# Patient Record
Sex: Male | Born: 1985 | Race: White | Hispanic: No | Marital: Married | Smoking: Never smoker
Health system: Southern US, Community
[De-identification: ages and names within clinical notes are randomized; demographics above are authoritative.]

## PROBLEM LIST (undated history)

## (undated) DIAGNOSIS — F32A Depression, unspecified: Secondary | ICD-10-CM

## (undated) HISTORY — DX: Depression, unspecified: F32.A

## (undated) HISTORY — PX: INGUINAL HERNIA REPAIR: SUR1180

## (undated) HISTORY — PX: ANTERIOR CRUCIATE LIGAMENT REPAIR: SHX115

---
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
## 2014-10-12 ENCOUNTER — Encounter (HOSPITAL_COMMUNITY): Payer: Self-pay

## 2014-10-12 ENCOUNTER — Emergency Department (HOSPITAL_COMMUNITY)
Admission: EM | Admit: 2014-10-12 | Discharge: 2014-10-13 | Disposition: A | Discharge status: Home / Self Care | Payer: BLUE CROSS/BLUE SHIELD | Attending: Emergency Medicine | Admitting: Emergency Medicine

## 2014-10-12 ENCOUNTER — Emergency Department (HOSPITAL_COMMUNITY): Payer: BLUE CROSS/BLUE SHIELD

## 2014-10-12 DIAGNOSIS — R42 Dizziness and giddiness: Secondary | ICD-10-CM | POA: Diagnosis not present

## 2014-10-12 DIAGNOSIS — R110 Nausea: Secondary | ICD-10-CM | POA: Insufficient documentation

## 2014-10-12 MED ORDER — SODIUM CHLORIDE 0.9 % IV BOLUS (SEPSIS)
1000.0000 mL | Freq: Once | INTRAVENOUS | Status: AC
  Administered 2014-10-12: 1000 mL via INTRAVENOUS

## 2014-10-12 MED ORDER — MECLIZINE HCL 12.5 MG PO TABS
25.0000 mg | ORAL_TABLET | Freq: Once | ORAL | Status: AC
  Administered 2014-10-12: 25 mg via ORAL
  Filled 2014-10-12: qty 2

## 2014-10-12 NOTE — ED Provider Notes (Signed)
CSN: [id number]     Arrival date & time 10/12/14  2123 History   This chart was scribed for Geoffery Lyonsouglas Alva Kuenzel, MD by Evon Slackerrance Branch, ED Scribe. This patient was seen in room APA15/APA15 and the patient's care was started at 11:26 PM.    Chief Complaint  Patient presents with  . Dizziness    Patient is a 29 y.o. male presenting with dizziness. The history is provided by the patient. No language interpreter was used.  Dizziness Quality:  Room spinning Severity:  Mild Onset quality:  Sudden Duration:  1 day Timing:  Intermittent Progression:  Unchanged Chronicity:  New Context: head movement and standing up   Relieved by:  None tried Worsened by:  Nothing Ineffective treatments:  None tried Associated symptoms: nausea   Associated symptoms: no tinnitus and no vomiting    HPI Comments: Ricky Day is a 29 y.o. male who presents to the Emergency Department complaining of new sudden onset of intermittent dizziness that began today 10 AM. Pt describes the dizziness as the room spinning. Pt states that he has associated nausea. Pt states that the dizziness is worse when sitting up or head movement. PT denies head injury, fever, tinnitus or vomiting.   History reviewed. No pertinent past medical history. History reviewed. No pertinent past surgical history. History reviewed. No pertinent family history. History  Substance Use Topics  . Smoking status: Never Smoker   . Smokeless tobacco: Never Used  . Alcohol Use: Yes     Comment: occasional    Review of Systems  Constitutional: Negative for fever.  HENT: Negative for tinnitus.   Gastrointestinal: Positive for nausea. Negative for vomiting.  Neurological: Positive for dizziness. Negative for syncope.  All other systems reviewed and are negative.    Allergies  Review of patient's allergies indicates no known allergies.  Home Medications   Prior to Admission medications   Not on File   BP 107/45 mmHg  Pulse 89  Temp(Src)  98.1 F (36.7 C) (Oral)  Resp 18  Ht 5\' 7"  (1.702 m)  Wt 240 lb (108.863 kg)  BMI 37.58 kg/m2  SpO2 98%   Physical Exam  Constitutional: He is oriented to person, place, and time. He appears well-developed and well-nourished. No distress.  HENT:  Head: Normocephalic and atraumatic.  Eyes: Conjunctivae and EOM are normal. Pupils are equal, round, and reactive to light.  Neck: Neck supple. No tracheal deviation present.  Cardiovascular: Normal rate and regular rhythm.   No murmur heard. Pulmonary/Chest: Effort normal and breath sounds normal. No respiratory distress. He has no wheezes. He has no rales.  Abdominal: Soft. There is no tenderness.  Musculoskeletal: Normal range of motion.  Neurological: He is alert and oriented to person, place, and time.  Skin: Skin is warm and dry.  Psychiatric: He has a normal mood and affect. His behavior is normal.  Nursing note and vitals reviewed.   ED Course  Procedures (including critical care time) DIAGNOSTIC STUDIES: Oxygen Saturation is 98% on RA, normal by my interpretation.    COORDINATION OF CARE: 11:30 PM-Discussed treatment plan with pt at bedside and pt agreed to plan.     Labs Review Labs Reviewed - No data to display  Imaging Review No results found.   EKG Interpretation None      MDM   Final diagnoses:  None      Patient presents with complaints of dizziness that started this morning. This is described as a spinning sensation and is worse  with movement and turning his head. His history, exam, and workup findings are most consistent with a peripheral vertigo. He is feeling better with meclizine and I believe is appropriate for discharge. He will be given a prescription for meclizine and advised to return if his symptoms significantly worsen or change.   I personally performed the services described in this documentation, which was scribed in my presence. The recorded information has been reviewed and is  accurate.      Geoffery Lyons, MD 10/13/14 [phone]

## 2014-10-12 NOTE — ED Notes (Signed)
Patient states he began to have dizziness that started this morning when he stands up, patient states nausea with dizzy spell.

## 2014-10-13 LAB — CBC WITH DIFFERENTIAL/PLATELET
Basophils Absolute: 0 10*3/uL (ref 0.0–0.1)
Basophils Relative: 0 % (ref 0–1)
Eosinophils Absolute: 0.2 10*3/uL (ref 0.0–0.7)
Eosinophils Relative: 2 % (ref 0–5)
HCT: 49.3 % (ref 39.0–52.0)
Hemoglobin: 16.7 g/dL (ref 13.0–17.0)
Lymphocytes Relative: 38 % (ref 12–46)
Lymphs Abs: 3.8 10*3/uL (ref 0.7–4.0)
MCH: 30.6 pg (ref 26.0–34.0)
MCHC: 33.9 g/dL (ref 30.0–36.0)
MCV: 90.5 fL (ref 78.0–100.0)
Monocytes Absolute: 0.6 10*3/uL (ref 0.1–1.0)
Monocytes Relative: 6 % (ref 3–12)
Neutro Abs: 5.4 10*3/uL (ref 1.7–7.7)
Neutrophils Relative %: 54 % (ref 43–77)
Platelets: 181 10*3/uL (ref 150–400)
RBC: 5.45 MIL/uL (ref 4.22–5.81)
RDW: 13.1 % (ref 11.5–15.5)
WBC: 10 10*3/uL (ref 4.0–10.5)

## 2014-10-13 LAB — COMPREHENSIVE METABOLIC PANEL
ALT: 75 U/L — ABNORMAL HIGH (ref 0–53)
AST: 35 U/L (ref 0–37)
Albumin: 4.3 g/dL (ref 3.5–5.2)
Alkaline Phosphatase: 86 U/L (ref 39–117)
Anion gap: 6 (ref 5–15)
BUN: 13 mg/dL (ref 6–23)
CO2: 28 mmol/L (ref 19–32)
Calcium: 9.5 mg/dL (ref 8.4–10.5)
Chloride: 106 mmol/L (ref 96–112)
Creatinine, Ser: 1 mg/dL (ref 0.50–1.35)
GFR calc Af Amer: >90 mL/min (ref >90)
GFR calc non Af Amer: >90 mL/min (ref >90)
Glucose, Bld: 95 mg/dL (ref 70–99)
Potassium: 4.1 mmol/L (ref 3.5–5.1)
Sodium: 140 mmol/L (ref 135–145)
Total Bilirubin: 0.4 mg/dL (ref 0.3–1.2)
Total Protein: 7.7 g/dL (ref 6.0–8.3)

## 2014-10-13 MED ORDER — MECLIZINE HCL 25 MG PO TABS: 25.0000 mg | ORAL_TABLET | Freq: Three times a day (TID) | ORAL | Status: DC | PRN

## 2014-10-13 NOTE — Discharge Instructions (Signed)
Meclizine as prescribed as needed for dizziness. ° °Return to the emergency department if your symptoms significantly worsen or change. ° ° °Vertigo °Vertigo means you feel like you or your surroundings are moving when they are not. Vertigo can be dangerous if it occurs when you are at work, driving, or performing difficult activities.  °CAUSES  °Vertigo occurs when there is a conflict of signals sent to your brain from the visual and sensory systems in your body. There are many different causes of vertigo, including: °· Infections, especially in the inner ear. °· A bad reaction to a drug or misuse of alcohol and medicines. °· Withdrawal from drugs or alcohol. °· Rapidly changing positions, such as lying down or rolling over in bed. °· A migraine headache. °· Decreased blood flow to the brain. °· Increased pressure in the brain from a head injury, infection, tumor, or bleeding. °SYMPTOMS  °You may feel as though the world is spinning around or you are falling to the ground. Because your balance is upset, vertigo can cause nausea and vomiting. You may have involuntary eye movements (nystagmus). °DIAGNOSIS  °Vertigo is usually diagnosed by physical exam. If the cause of your vertigo is unknown, your caregiver may perform imaging tests, such as an MRI scan (magnetic resonance imaging). °TREATMENT  °Most cases of vertigo resolve on their own, without treatment. Depending on the cause, your caregiver may prescribe certain medicines. If your vertigo is related to body position issues, your caregiver may recommend movements or procedures to correct the problem. In rare cases, if your vertigo is caused by certain inner ear problems, you may need surgery. °HOME CARE INSTRUCTIONS  °· Follow your caregiver's instructions. °· Avoid driving. °· Avoid operating heavy machinery. °· Avoid performing any tasks that would be dangerous to you or others during a vertigo episode. °· Tell your caregiver if you notice that certain  medicines seem to be causing your vertigo. Some of the medicines used to treat vertigo episodes can actually make them worse in some people. °SEEK IMMEDIATE MEDICAL CARE IF:  °· Your medicines do not relieve your vertigo or are making it worse. °· You develop problems with talking, walking, weakness, or using your arms, hands, or legs. °· You develop severe headaches. °· Your nausea or vomiting continues or gets worse. °· You develop visual changes. °· A family member notices behavioral changes. °· Your condition gets worse. °MAKE SURE YOU: °· Understand these instructions. °· Will watch your condition. °· Will get help right away if you are not doing well or get worse. °Document Released: 04/11/2005 Document Revised: 09/24/2011 Document Reviewed: 01/18/2011 °Exit[employer]® Patient Information ©2015 [employer]. This information is not intended to replace advice given to you by your health care provider. Make sure you discuss any questions you have with your health care provider. ° °

## 2014-10-13 NOTE — ED Notes (Signed)
Discharge instructions given and reviewed with patient.  Patient verbalized understanding to take medication as directed and to return to ED for any worsening symptoms.  Patient ambulatory; discharged home in good condition.

## 2016-03-10 IMAGING — CT CT HEAD W/O CM
1 series · 16 of 30 positions shown, 20 images · non-contrast
Comparison: None.

CLINICAL DATA: Acute onset of dizziness.  Initial encounter.

EXAM:
CT HEAD WITHOUT CONTRAST
TECHNIQUE: Contiguous axial images were obtained from the base of the skull
through the vertex without intravenous contrast.

[Series 2: headtrauma 4.8 h37s · axial · 0.46mm/px · z∈[+63,+193]mm · 16 of 30 slices shown, 20 images]
[im 2/30  brain]
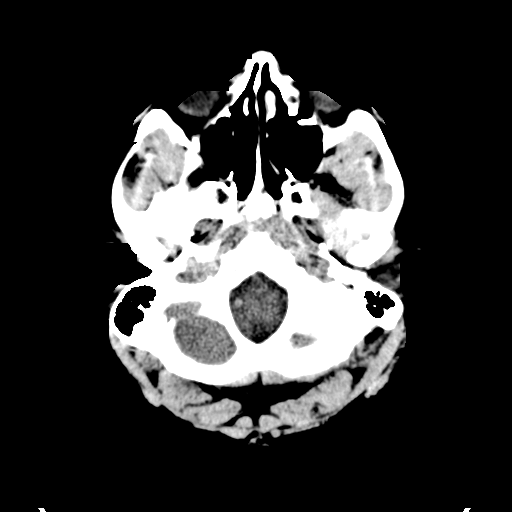
[im 2/30  bone]
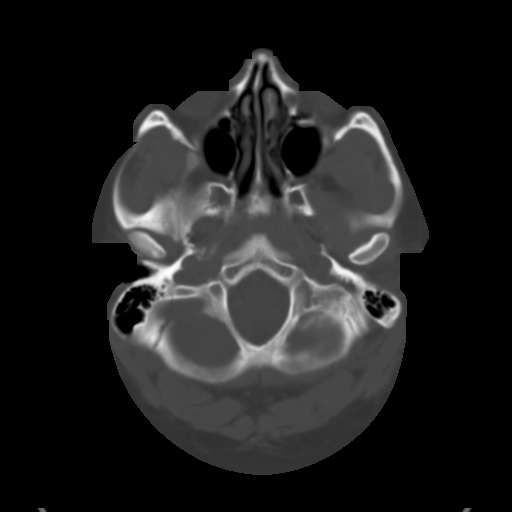
[im 4/30  brain]
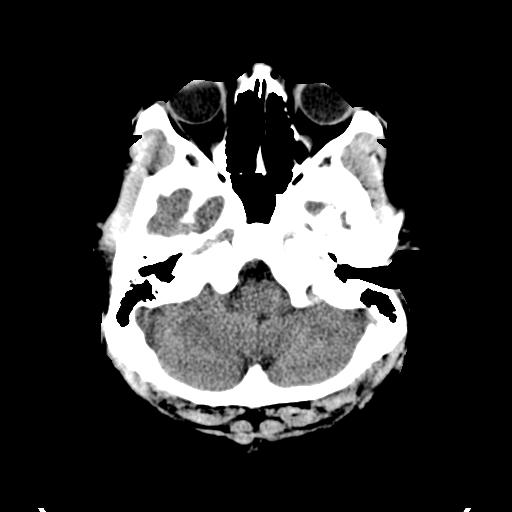
[im 6/30  brain]
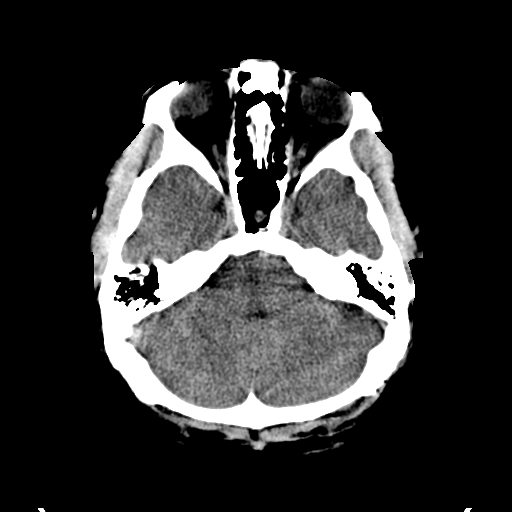
[im 8/30  brain]
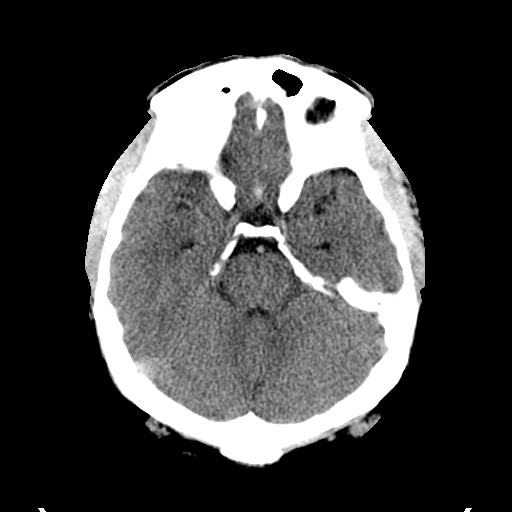
[im 9/30  brain]
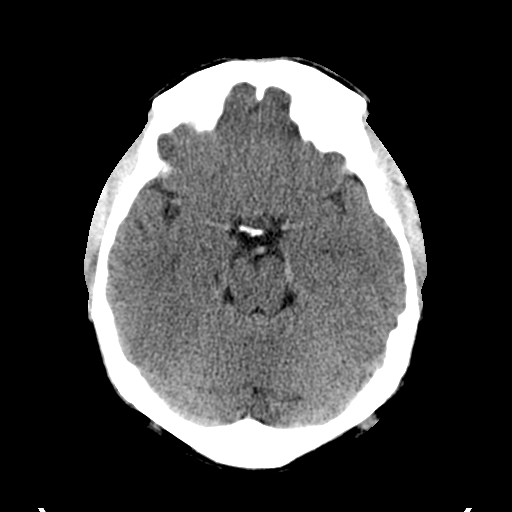
[im 9/30  bone]
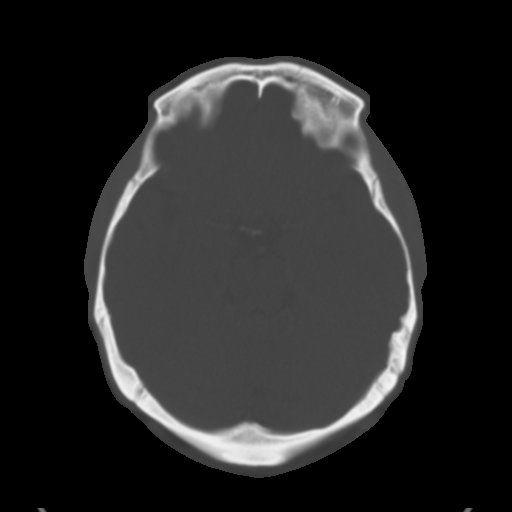
[im 11/30  brain]
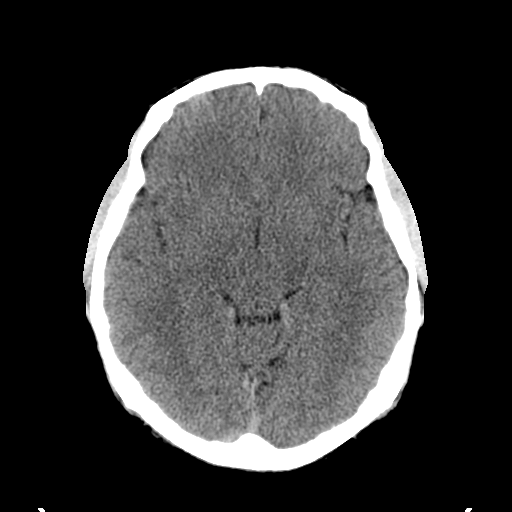
[im 13/30  brain]
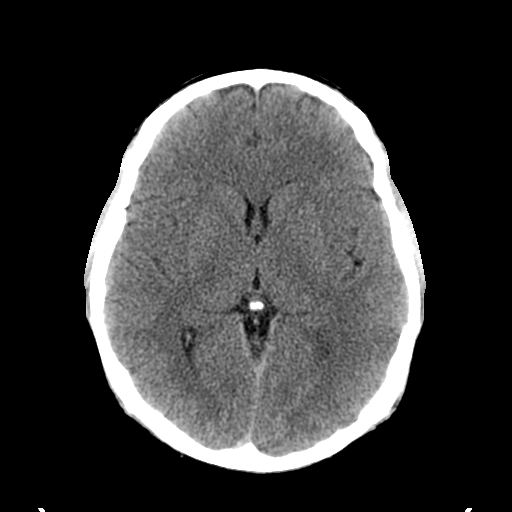
[im 15/30  brain]
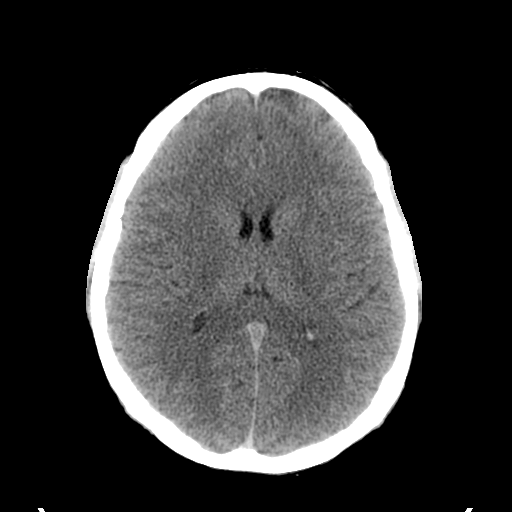
[im 16/30  brain]
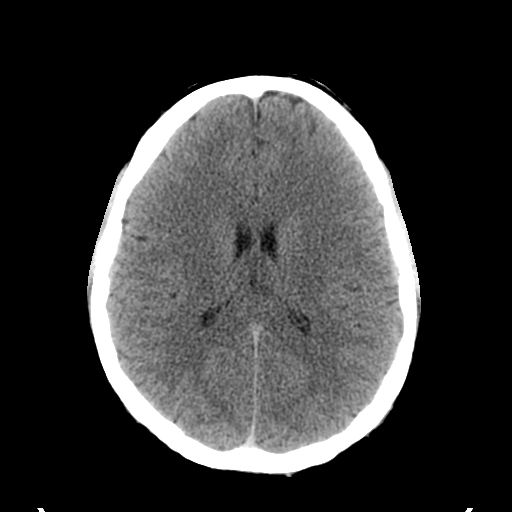
[im 16/30  bone]
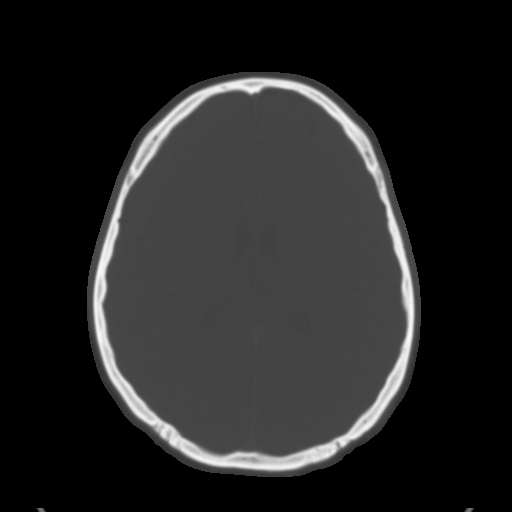
[im 18/30  brain]
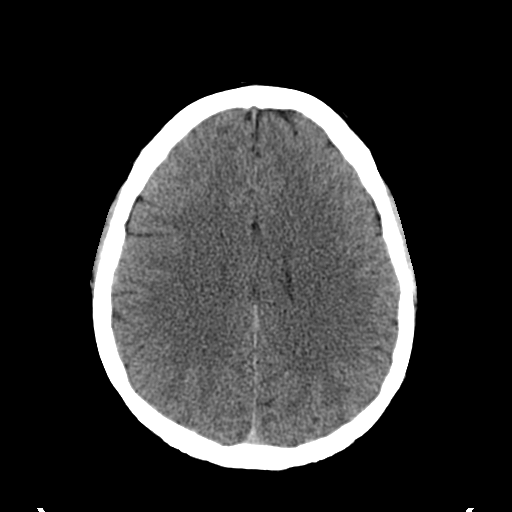
[im 20/30  brain]
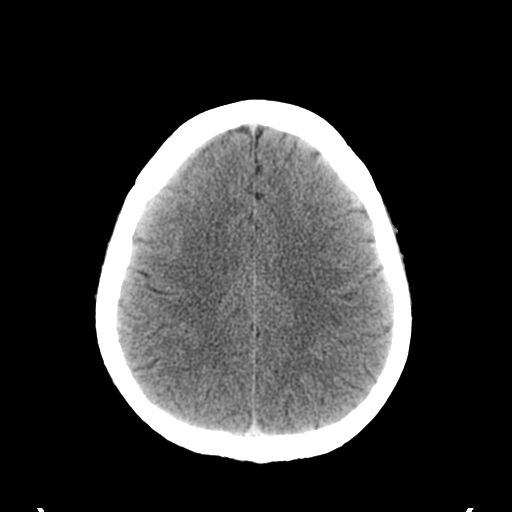
[im 22/30  brain]
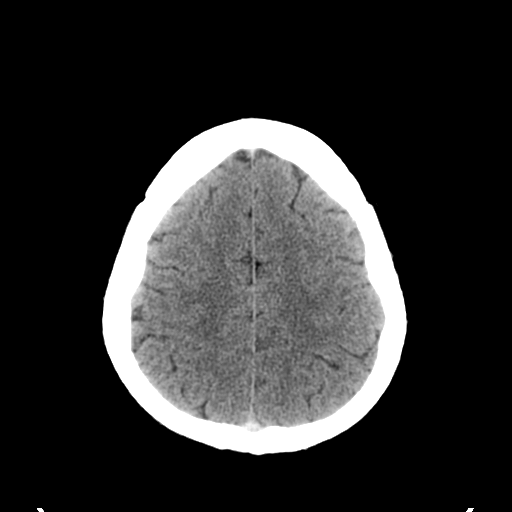
[im 23/30  brain]
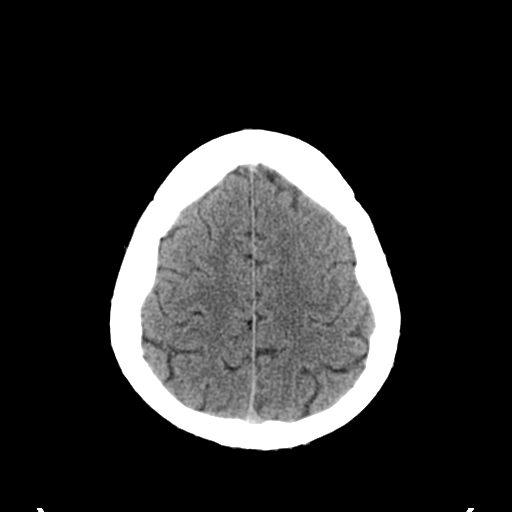
[im 23/30  bone]
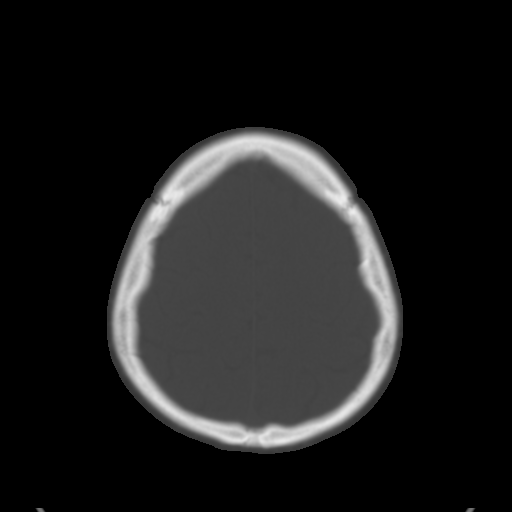
[im 25/30  brain]
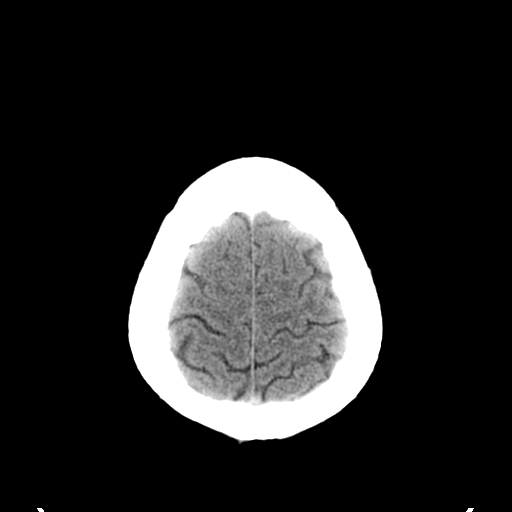
[im 27/30  brain]
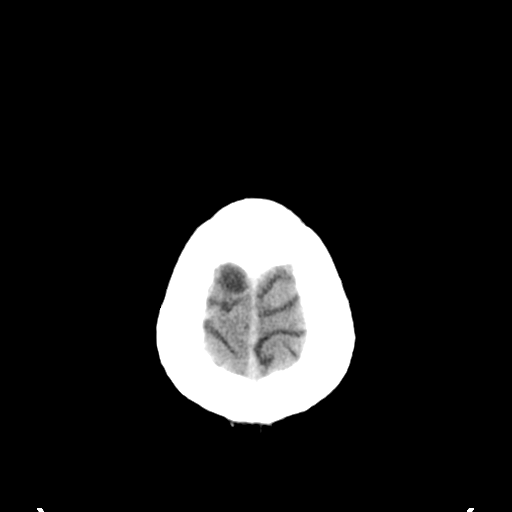
[im 29/30  brain]
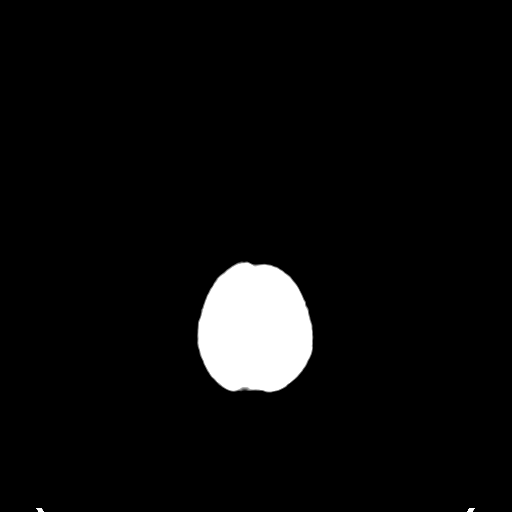

[16 of 30 positions shown; findings below may reference images not displayed]

FINDINGS: There is no evidence of acute infarction, mass lesion, or intra- or
extra-axial hemorrhage on CT.

The posterior fossa, including the cerebellum, brainstem and fourth
ventricle, is within normal limits. The third and lateral
ventricles, and basal ganglia are unremarkable in appearance. The
cerebral hemispheres are symmetric in appearance, with normal
gray-white differentiation. No mass effect or midline shift is seen.

There is no evidence of fracture; visualized osseous structures are
unremarkable in appearance. The visualized portions of the orbits
are within normal limits. The paranasal sinuses and mastoid air
cells are well-aerated. No significant soft tissue abnormalities are
seen.
IMPRESSION: Unremarkable noncontrast CT of the head.

## 2018-02-06 ENCOUNTER — Ambulatory Visit (INDEPENDENT_AMBULATORY_CARE_PROVIDER_SITE_OTHER): Payer: BLUE CROSS/BLUE SHIELD | Admitting: *Deleted

## 2018-02-06 DIAGNOSIS — Z23 Encounter for immunization: Secondary | ICD-10-CM | POA: Diagnosis not present

## 2018-02-06 NOTE — Progress Notes (Signed)
Pt given Twinrix vaccine Tolerated well 

## 2018-09-26 ENCOUNTER — Ambulatory Visit (INDEPENDENT_AMBULATORY_CARE_PROVIDER_SITE_OTHER): Payer: BLUE CROSS/BLUE SHIELD | Admitting: *Deleted

## 2018-09-26 ENCOUNTER — Other Ambulatory Visit: Payer: Self-pay

## 2018-09-26 DIAGNOSIS — Z23 Encounter for immunization: Secondary | ICD-10-CM

## 2019-04-29 ENCOUNTER — Other Ambulatory Visit: Payer: Self-pay

## 2019-04-29 DIAGNOSIS — Z20822 Contact with and (suspected) exposure to covid-19: Secondary | ICD-10-CM

## 2019-04-30 LAB — NOVEL CORONAVIRUS, NAA: SARS-CoV-2, NAA: NOT DETECTED

## 2020-03-17 ENCOUNTER — Ambulatory Visit: Payer: BC Managed Care – PPO | Attending: Internal Medicine

## 2020-03-17 DIAGNOSIS — Z23 Encounter for immunization: Secondary | ICD-10-CM

## 2020-03-17 NOTE — Progress Notes (Signed)
   Covid-19 Vaccination Clinic  Name:  Ricky Day    MRN: [id number] DOB: 12-01-1985  03/17/2020  Mr. [name] was observed post Covid-19 immunization for 15 minutes without incident. He was provided with Vaccine Information Sheet and instruction to access the V-Safe system.   Mr. [name] was instructed to call 911 with any severe reactions post vaccine: Marland Kitchen Difficulty breathing  . Swelling of face and throat  . A fast heartbeat  . A bad rash all over body  . Dizziness and weakness   Immunizations Administered    Name Date Dose VIS Date Route   Pfizer COVID-19 Vaccine 03/17/2020  3:18 PM 0.3 mL 09/09/2018 Intramuscular   Manufacturer: [employer], [employer]   Lot: [id number]   NDC: [address]-2      Covid-19 Vaccination Clinic  Name:  Ricky Day    MRN: [id number] DOB: February 23, 1986  03/17/2020  Mr. [name] was observed post Covid-19 immunization for 15 minutes without incident. He was provided with Vaccine Information Sheet and instruction to access the V-Safe system.   Mr. [name] was instructed to call 911 with any severe reactions post vaccine: Marland Kitchen Difficulty breathing  . Swelling of face and throat  . A fast heartbeat  . A bad rash all over body  . Dizziness and weakness   Immunizations Administered    Name Date Dose VIS Date Route   Pfizer COVID-19 Vaccine 03/17/2020  3:18 PM 0.3 mL 09/09/2018 Intramuscular   Manufacturer: [employer], [employer]   Lot: [id number]   NDC: [address]-1

## 2020-04-07 ENCOUNTER — Ambulatory Visit: Payer: BC Managed Care – PPO

## 2020-04-07 ENCOUNTER — Ambulatory Visit: Payer: BC Managed Care – PPO | Attending: Internal Medicine

## 2020-04-07 DIAGNOSIS — Z23 Encounter for immunization: Secondary | ICD-10-CM

## 2020-04-07 NOTE — Progress Notes (Signed)
   Covid-19 Vaccination Clinic  Name:  Ricky Day    MRN: [id number] DOB: 10-Oct-1985  04/07/2020  [name] was observed post Covid-19 immunization for 15 minutes without incident. He was provided with Vaccine Information Sheet and instruction to access the V-Safe system.   [name] was instructed to call 911 with any severe reactions post vaccine: Marland Kitchen Difficulty breathing  . Swelling of face and throat  . A fast heartbeat  . A bad rash all over body  . Dizziness and weakness   Immunizations Administered    Name Date Dose VIS Date Route   Pfizer COVID-19 Vaccine 04/07/2020  1:39 PM 0.3 mL 09/09/2018 Intramuscular   Manufacturer: [employer], [employer]   Lot: [id number]   NDC: [id number]

## 2021-09-05 ENCOUNTER — Encounter (HOSPITAL_COMMUNITY): Payer: Self-pay

## 2021-09-05 ENCOUNTER — Other Ambulatory Visit: Payer: Self-pay

## 2021-09-05 ENCOUNTER — Emergency Department (HOSPITAL_COMMUNITY)
Admission: EM | Admit: 2021-09-05 | Discharge: 2021-09-05 | Disposition: A | Discharge status: Home / Self Care | Payer: Worker's Compensation | Attending: Emergency Medicine | Admitting: Emergency Medicine

## 2021-09-05 DIAGNOSIS — Z0283 Encounter for blood-alcohol and blood-drug test: Secondary | ICD-10-CM

## 2021-09-05 DIAGNOSIS — Y900 Blood alcohol level of less than 20 mg/100 ml: Secondary | ICD-10-CM | POA: Insufficient documentation

## 2021-09-05 DIAGNOSIS — Z0289 Encounter for other administrative examinations: Secondary | ICD-10-CM | POA: Diagnosis not present

## 2021-09-05 LAB — RAPID URINE DRUG SCREEN, HOSP PERFORMED
Amphetamines: NOT DETECTED
Barbiturates: NOT DETECTED
Benzodiazepines: NOT DETECTED
Cocaine: NOT DETECTED
Opiates: NOT DETECTED
Tetrahydrocannabinol: NOT DETECTED

## 2021-09-05 LAB — ETHANOL: Alcohol, Ethyl (B): <10 mg/dL (ref <10)

## 2021-09-05 NOTE — ED Provider Notes (Signed)
°  [facility]-EMERGENCY DEPT Provider Note   CSN: [id number] Arrival date & time: 09/05/21  0159     History  Chief Complaint  Patient presents with   Urine Drug screen    Ricky Day is a 37 y.o. male.  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  He has no acute medical complaints.  Otherwise feels well.  The history is provided by the patient. No language interpreter was used.      Home Medications Prior to Admission medications   Medication Sig Start Date End Date Taking? Authorizing Provider  meclizine (ANTIVERT) 25 MG tablet Take 1 tablet (25 mg total) by mouth 3 (three) times daily as needed for dizziness. 10/13/14   Geoffery Lyons, MD      Allergies    Patient has no known allergies.    Review of Systems   Review of Systems Ten systems reviewed and are negative for acute change, except as noted in the HPI.    Physical Exam Updated Vital Signs BP 135/81 (BP Location: Right Arm)    Pulse 74    Temp 97.9 F (36.6 C) (Oral)    Resp 18    SpO2 95%   Physical Exam Vitals and nursing note reviewed.  Constitutional:      General: He is not in acute distress.    Appearance: He is well-developed. He is not diaphoretic.  HENT:     Head: Normocephalic and atraumatic.  Eyes:     General: No scleral icterus.    Conjunctiva/sclera: Conjunctivae normal.  Pulmonary:     Effort: Pulmonary effort is normal. No respiratory distress.  Musculoskeletal:        General: Normal range of motion.     Cervical back: Normal range of motion.  Skin:    General: Skin is warm and dry.     Coloration: Skin is not pale.     Findings: No erythema or rash.  Neurological:     Mental Status: He is alert and oriented to person, place, and time.  Psychiatric:        Behavior: Behavior normal.    ED Results / Procedures / Treatments   Labs (all labs ordered are listed, but only abnormal results are  displayed) Labs Reviewed  RAPID URINE DRUG SCREEN, HOSP PERFORMED  ETHANOL    EKG None  Radiology No results found.  Procedures Procedures    Medications Ordered in ED Medications - No data to display  ED Course/ Medical Decision Making/ A&P                           Medical Decision Making Amount and/or Complexity of Data Reviewed Labs: ordered.   Patient presenting for routine drug screening at the request of his supervisor.  Works as a Chartered loss adjuster.  Has no acute medical complaints.  Discharged following completion of blood draw and urine collection.        Final Clinical Impression(s) / ED Diagnoses Final diagnoses:  Encounter for drug screening    Rx / DC Orders ED Discharge Orders     None         Antony Madura, PA-C 09/05/21 0233    Franne Forts, DO 09/06/21 [phone]

## 2021-09-05 NOTE — ED Triage Notes (Signed)
Pt needs a urine drug screen for his [employer] job.

## 2022-01-18 DIAGNOSIS — K4090 Unilateral inguinal hernia, without obstruction or gangrene, not specified as recurrent: Secondary | ICD-10-CM | POA: Insufficient documentation

## 2022-02-07 ENCOUNTER — Ambulatory Visit: Payer: BLUE CROSS/BLUE SHIELD | Admitting: Family Medicine

## 2022-02-12 DIAGNOSIS — Z8719 Personal history of other diseases of the digestive system: Secondary | ICD-10-CM | POA: Insufficient documentation

## 2022-05-02 ENCOUNTER — Encounter: Payer: Self-pay | Admitting: Family Medicine

## 2022-05-02 ENCOUNTER — Ambulatory Visit: Payer: BC Managed Care – PPO | Admitting: Family Medicine

## 2022-05-02 VITALS — BP 123/70 | HR 75 | Temp 97.9°F | Ht 67.0 in | Wt 226.8 lb

## 2022-05-02 DIAGNOSIS — R5383 Other fatigue: Secondary | ICD-10-CM | POA: Diagnosis not present

## 2022-05-02 DIAGNOSIS — Z0000 Encounter for general adult medical examination without abnormal findings: Secondary | ICD-10-CM

## 2022-05-02 DIAGNOSIS — Z0001 Encounter for general adult medical examination with abnormal findings: Secondary | ICD-10-CM | POA: Diagnosis not present

## 2022-05-02 NOTE — Progress Notes (Signed)
Subjective:  Patient ID: Ricky Day, male    DOB: 1985-08-19  Age: 36 y.o. MRN: [id number]  CC: Establish Care   HPI Ricky Day presents for New pt. CPE  Recent hernia surgery. 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 Now having trouble motivating himself to his workout routine. Energy low.     05/02/2022   11:47 AM 05/02/2022   10:55 AM  Depression screen PHQ 2/9  Decreased Interest 2 0  Down, Depressed, Hopeless 3 0  PHQ - 2 Score 5 0  Altered sleeping 3   Tired, decreased energy 3   Change in appetite 3   Feeling bad or failure about yourself  0   Trouble concentrating 2   Moving slowly or fidgety/restless 2   Suicidal thoughts 2   PHQ-9 Score 20   Difficult doing work/chores Very difficult     History Ricky Day has a past medical history of Depression.   He has a past surgical history that includes Anterior cruciate ligament repair (Right) and Inguinal hernia repair (Left).   His family history includes Diabetes in his maternal grandmother; Seizures in his mother.He reports that he has never smoked. He has never used smokeless tobacco. He reports current alcohol use of about 1.0 standard drink of alcohol per week. He reports that he does not use drugs.    ROS Review of Systems  Constitutional:  Positive for fatigue. Negative for activity change and unexpected weight change.  HENT:  Negative for congestion, ear pain, hearing loss, postnasal drip and trouble swallowing.   Eyes:  Negative for pain and visual disturbance.  Respiratory:  Negative for cough, chest tightness and shortness of breath.   Cardiovascular:  Negative for chest pain, palpitations and leg swelling.  Gastrointestinal:  Negative for abdominal distention, abdominal pain, blood in stool, constipation, diarrhea, nausea and vomiting.  Endocrine: Negative for cold intolerance, heat intolerance and polydipsia.  Genitourinary:  Negative for difficulty urinating, dysuria, flank pain,  frequency and urgency.  Musculoskeletal:  Negative for arthralgias and joint swelling.  Skin:  Negative for color change, rash and wound.  Neurological:  Negative for dizziness, syncope, speech difficulty, weakness, light-headedness, numbness and headaches.  Hematological:  Does not bruise/bleed easily.  Psychiatric/Behavioral:  Negative for confusion, decreased concentration, dysphoric mood and sleep disturbance. The patient is not nervous/anxious.     Objective:  BP 123/70   Pulse 75   Temp 97.9 F (36.6 C)   Ht $R'5\' 7"'Ac$  (1.702 m)   Wt 226 lb 12.8 oz (102.9 kg)   SpO2 94%   BMI 35.52 kg/m   BP Readings from Last 3 Encounters:  05/02/22 123/70  09/05/21 135/81  10/13/14 (!) 110/52    Wt Readings from Last 3 Encounters:  05/02/22 226 lb 12.8 oz (102.9 kg)  10/12/14 240 lb (108.9 kg)     Physical Exam Constitutional:      Appearance: He is well-developed.  HENT:     Head: Normocephalic and atraumatic.  Eyes:     Pupils: Pupils are equal, round, and reactive to light.  Neck:     Thyroid: No thyromegaly.     Trachea: No tracheal deviation.  Cardiovascular:     Rate and Rhythm: Normal rate and regular rhythm.     Heart sounds: Normal heart sounds. No murmur heard.    No friction rub. No gallop.  Pulmonary:     Breath sounds: Normal breath sounds. No wheezing or rales.  Abdominal:  General: Bowel sounds are normal. There is no distension.     Palpations: Abdomen is soft. There is no mass.     Tenderness: There is no abdominal tenderness.     Hernia: There is no hernia in the left inguinal area.  Genitourinary:    Penis: Normal.      Testes: Normal.  Musculoskeletal:        General: Normal range of motion.     Cervical back: Normal range of motion.  Lymphadenopathy:     Cervical: No cervical adenopathy.  Skin:    General: Skin is warm and dry.  Neurological:     Mental Status: He is alert and oriented to person, place, and time.       Assessment & Plan:    Ricky Day was seen today for establish care.  Diagnoses and all orders for this visit:  Well adult exam -     CBC with Differential/Platelet -     CMP14+EGFR -     Lipid panel -     TSH  Fatigue, unspecified type -     TSH       I have discontinued Spyros T. Lenhardt's meclizine. I am also having him maintain his buPROPion and hydrOXYzine.  Allergies as of 05/02/2022   No Known Allergies      Medication List        Accurate as of May 02, 2022 12:16 PM. If you have any questions, ask your nurse or doctor.          STOP taking these medications    meclizine 25 MG tablet Commonly known as: ANTIVERT Stopped by: Claretta Fraise, MD       TAKE these medications    buPROPion 150 MG 24 hr tablet Commonly known as: WELLBUTRIN XL Take 150 mg by mouth daily.   hydrOXYzine 25 MG tablet Commonly known as: ATARAX Take 25 mg by mouth daily as needed.         Follow-up: Return in about 1 year (around 05/03/2023).  Claretta Fraise, M.D.

## 2022-05-03 LAB — CBC WITH DIFFERENTIAL/PLATELET
Basophils Absolute: 0 10*3/uL (ref 0.0–0.2)
Basos: 1 %
EOS (ABSOLUTE): 0.1 10*3/uL (ref 0.0–0.4)
Eos: 2 %
Hematocrit: 47 % (ref 37.5–51.0)
Hemoglobin: 15.7 g/dL (ref 13.0–17.7)
Immature Grans (Abs): 0 10*3/uL (ref 0.0–0.1)
Immature Granulocytes: 0 %
Lymphocytes Absolute: 1.7 10*3/uL (ref 0.7–3.1)
Lymphs: 27 %
MCH: 31.3 pg (ref 26.6–33.0)
MCHC: 33.4 g/dL (ref 31.5–35.7)
MCV: 94 fL (ref 79–97)
Monocytes Absolute: 0.4 10*3/uL (ref 0.1–0.9)
Monocytes: 6 %
Neutrophils Absolute: 3.9 10*3/uL (ref 1.4–7.0)
Neutrophils: 64 %
Platelets: 176 10*3/uL (ref 150–450)
RBC: 5.02 x10E6/uL (ref 4.14–5.80)
RDW: 13.5 % (ref 11.6–15.4)
WBC: 6.1 10*3/uL (ref 3.4–10.8)

## 2022-05-03 LAB — CMP14+EGFR
ALT: 22 IU/L (ref 0–44)
AST: 26 IU/L (ref 0–40)
Albumin/Globulin Ratio: 2.1 (ref 1.2–2.2)
Albumin: 4.9 g/dL (ref 4.1–5.1)
Alkaline Phosphatase: 107 IU/L (ref 44–121)
BUN/Creatinine Ratio: 10 (ref 9–20)
BUN: 11 mg/dL (ref 6–20)
Bilirubin Total: 0.7 mg/dL (ref 0.0–1.2)
CO2: 20 mmol/L (ref 20–29)
Calcium: 10 mg/dL (ref 8.7–10.2)
Chloride: 102 mmol/L (ref 96–106)
Creatinine, Ser: 1.13 mg/dL (ref 0.76–1.27)
Globulin, Total: 2.3 g/dL (ref 1.5–4.5)
Glucose: 85 mg/dL (ref 70–99)
Potassium: 4.6 mmol/L (ref 3.5–5.2)
Sodium: 141 mmol/L (ref 134–144)
Total Protein: 7.2 g/dL (ref 6.0–8.5)
eGFR: 87 mL/min/{1.73_m2} (ref >59)

## 2022-05-03 LAB — LIPID PANEL
Chol/HDL Ratio: 4.5 ratio (ref 0.0–5.0)
Cholesterol, Total: 198 mg/dL (ref 100–199)
HDL: 44 mg/dL (ref >39)
LDL Chol Calc (NIH): 136 mg/dL — ABNORMAL HIGH (ref 0–99)
Triglycerides: 97 mg/dL (ref 0–149)
VLDL Cholesterol Cal: 18 mg/dL (ref 5–40)

## 2022-05-03 LAB — TSH: TSH: 2.06 u[IU]/mL (ref 0.450–4.500)

## 2022-05-07 ENCOUNTER — Other Ambulatory Visit: Payer: BC Managed Care – PPO

## 2022-10-22 ENCOUNTER — Telehealth: Payer: BC Managed Care – PPO | Admitting: Family Medicine

## 2022-10-22 ENCOUNTER — Encounter: Payer: Self-pay | Admitting: Family Medicine

## 2022-10-22 DIAGNOSIS — J0100 Acute maxillary sinusitis, unspecified: Secondary | ICD-10-CM

## 2022-10-22 MED ORDER — AMOXICILLIN 500 MG PO CAPS: 500.0000 mg | ORAL_CAPSULE | Freq: Two times a day (BID) | ORAL | 0 refills | Status: DC

## 2022-10-22 MED ORDER — FLUTICASONE PROPIONATE 50 MCG/ACT NA SUSP: 1.0000 | Freq: Two times a day (BID) | NASAL | 6 refills | Status: AC | PRN

## 2022-10-22 NOTE — Progress Notes (Signed)
Virtual Visit via MyChart video note  I connected with Ricky Day on 10/22/22 at 1312 by video and verified that I am speaking with the correct person using two identifiers. Ricky Day is currently located at car and patient are currently with her during visit. The provider, Elige Radon Tavarion Babington, MD is located in their office at time of visit.  Call ended at 1320  I discussed the limitations, risks, security and privacy concerns of performing an evaluation and management service by video and the availability of in person appointments. I also discussed with the patient that there may be a patient responsible charge related to this service. The patient expressed understanding and agreed to proceed.   History and Present Illness: Patient is calling in for sinus pressure and nasal drainage and cough.  This has been going on for 2 days. He denies fevers or chills or body aches. He has a son that had an ear infections. His wife had some cough as well. He does run into allergies.  He used tylenol sinus and it helped a little.  He does fight this with allergies a couple times a year. He does take an allergy pill sometimes.   1. Acute non-recurrent maxillary sinusitis     Outpatient Encounter Medications as of 10/22/2022  Medication Sig   amoxicillin (AMOXIL) 500 MG capsule Take 1 capsule (500 mg total) by mouth 2 (two) times daily.   fluticasone (FLONASE) 50 MCG/ACT nasal spray Place 1 spray into both nostrils 2 (two) times daily as needed for allergies or rhinitis.   buPROPion (WELLBUTRIN XL) 150 MG 24 hr tablet Take 150 mg by mouth daily.   hydrOXYzine (ATARAX) 25 MG tablet Take 25 mg by mouth daily as needed.   No facility-administered encounter medications on file as of 10/22/2022.    Review of Systems  Constitutional:  Negative for chills and fever.  HENT:  Positive for congestion, postnasal drip, rhinorrhea, sinus pressure and sneezing. Negative for ear discharge, ear pain, sore throat and  voice change.   Eyes:  Negative for pain, discharge, redness and visual disturbance.  Respiratory:  Positive for cough. Negative for shortness of breath and wheezing.   Cardiovascular:  Negative for chest pain and leg swelling.  Skin:  Negative for rash.  All other systems reviewed and are negative.   Observations/Objective: Patient sounds comfortable and in no acute distres.  Assessment and Plan: Problem List Items Addressed This Visit   None Visit Diagnoses     Acute non-recurrent maxillary sinusitis    -  Primary   Relevant Medications   fluticasone (FLONASE) 50 MCG/ACT nasal spray   amoxicillin (AMOXIL) 500 MG capsule       Will treat like sinus infection with amoxicillin and Flonase.  Recommended he be a little more vigilant about his antihistamine this time of year in the future. Follow up plan: Return if symptoms worsen or fail to improve.     I discussed the assessment and treatment plan with the patient. The patient was provided an opportunity to ask questions and all were answered. The patient agreed with the plan and demonstrated an understanding of the instructions.   The patient was advised to call back or seek an in-person evaluation if the symptoms worsen or if the condition fails to improve as anticipated.  The above assessment and management plan was discussed with the patient. The patient verbalized understanding of and has agreed to the management plan. Patient is aware to call the  clinic if symptoms persist or worsen. Patient is aware when to return to the clinic for a follow-up visit. Patient educated on when it is appropriate to go to the emergency department.    I provided 8 minutes of non-face-to-face time during this encounter.    Nils Pyle, MD

## 2022-10-26 ENCOUNTER — Ambulatory Visit: Payer: BC Managed Care – PPO | Admitting: Nurse Practitioner

## 2022-10-29 ENCOUNTER — Encounter: Payer: Self-pay | Admitting: Family Medicine

## 2023-08-30 ENCOUNTER — Telehealth: Payer: 59 | Admitting: Nurse Practitioner

## 2023-08-30 ENCOUNTER — Ambulatory Visit: Payer: Self-pay | Admitting: Family Medicine

## 2023-08-30 DIAGNOSIS — J0140 Acute pansinusitis, unspecified: Secondary | ICD-10-CM

## 2023-08-30 MED ORDER — DOXYCYCLINE HYCLATE 100 MG PO TABS: 100.0000 mg | ORAL_TABLET | Freq: Two times a day (BID) | ORAL | 0 refills | Status: AC

## 2023-08-30 NOTE — Progress Notes (Signed)
Virtual Visit Consent   [name], you are scheduled for a virtual visit with a [facility] provider today. Just as with appointments in the office, your consent must be obtained to participate. Your consent will be active for this visit and any virtual visit you may have with one of our providers in the next 365 days. If you have a MyChart account, a copy of this consent can be sent to you electronically.  As this is a virtual visit, video technology does not allow for your provider to perform a traditional examination. This may limit your provider's ability to fully assess your condition. If your provider identifies any concerns that need to be evaluated in person or the need to arrange testing (such as labs, EKG, etc.), we will make arrangements to do so. Although advances in technology are sophisticated, we cannot ensure that it will always work on either your end or our end. If the connection with a video visit is poor, the visit may have to be switched to a telephone visit. With either a video or telephone visit, we are not always able to ensure that we have a secure connection.  By engaging in this virtual visit, you consent to the provision of healthcare and authorize for your insurance to be billed (if applicable) for the services provided during this visit. Depending on your insurance coverage, you may receive a charge related to this service.  I need to obtain your verbal consent now. Are you willing to proceed with your visit today? LAKSHYA MCGILLICUDDY has provided verbal consent on 08/30/2023 for a virtual visit (video or telephone). Viviano Simas, FNP  Date: 08/30/2023 5:12 PM   Virtual Visit via Video Note   I, Viviano Simas, connected with  Ricky Day  ([id number], [birth year]) on 08/30/23 at  5:15 PM EST by a video-enabled telemedicine application and verified that I am speaking with the correct person using two identifiers.  Location: Patient: Virtual Visit Location Patient:  Home Provider: Virtual Visit Location Provider: Home Office   I discussed the limitations of evaluation and management by telemedicine and the availability of in person appointments. The patient expressed understanding and agreed to proceed.    History of Present Illness: Ricky Day is a 38 y.o. who identifies as a male who was assigned male at birth, and is being seen today for sore throat  Symptom onset was one week ago  He tested positive for strep one week ago, he was on an antibiotic (amoxicillin) since being on that medication he has developed more sinus symptoms, cough and headache from pressure in sinuses   He has been using Mucinex D for minimal relief   Denies a history of asthma or need for inhalers in the past   Problems: There are no active problems to display for this patient.   Allergies: No Known Allergies Medications:  Current Outpatient Medications:    buPROPion (WELLBUTRIN XL) 150 MG 24 hr tablet, Take 150 mg by mouth daily., Disp: , Rfl:    fluticasone (FLONASE) 50 MCG/ACT nasal spray, Place 1 spray into both nostrils 2 (two) times daily as needed for allergies or rhinitis., Disp: 16 g, Rfl: 6   hydrOXYzine (ATARAX) 25 MG tablet, Take 25 mg by mouth daily as needed., Disp: , Rfl:   Observations/Objective: Patient is well-developed, well-nourished in no acute distress. Resting comfortably  at home.  Head is normocephalic, atraumatic.  No labored breathing.  Speech is clear and coherent with logical content.  Patient is alert and oriented at baseline.    Assessment and Plan:  1. Acute non-recurrent pansinusitis (Primary)  Continue Mucinex D as needed and flonase   - doxycycline (VIBRA-TABS) 100 MG tablet; Take 1 tablet (100 mg total) by mouth 2 (two) times daily for 7 days.  Dispense: 14 tablet; Refill: 0      Follow Up Instructions: I discussed the assessment and treatment plan with the patient. The patient was provided an opportunity to ask questions  and all were answered. The patient agreed with the plan and demonstrated an understanding of the instructions.  A copy of instructions were sent to the patient via MyChart unless otherwise noted below.     The patient was advised to call back or seek an in-person evaluation if the symptoms worsen or if the condition fails to improve as anticipated.    Viviano Simas, FNP

## 2023-08-30 NOTE — Telephone Encounter (Signed)
  Chief Complaint: sore throat Symptoms: sore throat, headache, congestion, cough Frequency: began 2/7 he believes, worsening yesterday Pertinent Negatives: Patient denies Fever, SOB, NVD Disposition: [] ED /[x] Urgent Care (no appt availability in office) / [] Appointment(In office/virtual)/ []  [facility]/ [] Home Care/ [] Refused Recommended Disposition /[] [facility]/ []  Follow-up with PCP Additional Notes: Patient calls reporting worsening sore throat. States he was seen in urgent care on 2/7 or 2/8 and began an antibiotic and steroid, states symptoms improved some but have returned yesterday. States he is at the end of the antibiotic. Per protocol, patient to be evaluated within 4 hours. First available appointment with PCP is 09/05/23, with any provider in clinic 09/02/23. Patient declined to be seen in another office. Was able to schedule patient with virtual urgent care visit for 1715 today. Patient states he will contact insurance and if the copay is too high he will cancel. Care advice reviewed.  Alerting PCP for review.    Copied from CRM [id number]. Topic: Clinical - Prescription Issue >> Aug 30, 2023 11:34 AM Ivette P wrote: Reason for CRM: Pt  has been sick past weeks, went to UC, diganosed with strep. not getting better. Pt would like an antibiotic to help with the sickness.   Pt callback [phone] Reason for Disposition  [1] Taking antibiotic > 72 hours (3 days) for strep throat AND [2] sore throat not improved  Answer Assessment - Initial Assessment Questions 1. SYMPTOM: "What's the main symptom you're concerned about?" (e.g., fever, difficulty swallowing, sore throat)     Sore throat 2. ANTIBIOTIC: "What antibiotic are you taking?" "How many times a day?"     Penicillin, on last few doses. 3. ONSET: "When was the antibiotic started?"     2/7 or 2/8 4. THROAT PAIN:  "How bad is the sore throat?" (Scale 1-10; mild, moderate or severe)   - MILD (1-3):  Doesn't  interfere with eating or normal activities.   - MODERATE (4-7): Interferes with eating some solids and normal activities.   - SEVERE (8-10):  Excruciating pain, interferes with most normal activities.   - SEVERE WITH DYSPHAGIA (10): Can't swallow liquids, drooling.     4/10 5. FEVER: "Do you have a fever?" If Yes, ask: "What is your temperature, how was it measured, and when did it start?"     Denies 6. OTHER SYMPTOMS: "Do you have any other symptoms?" (e.g., rash)     Cough, congestion, headache 7. BETTER-SAME-WORSE: "Are you getting better, staying the same, or getting worse compared to the day you started the antibiotics?"     Worsening at this time, reports improved some and then began feeling worse yesterday.  Protocols used: Strep Throat Infection on Antibiotic Follow-up Call-[facility]

## 2024-07-06 ENCOUNTER — Encounter: Payer: Self-pay | Admitting: Family Medicine

## 2024-07-06 ENCOUNTER — Ambulatory Visit: Admitting: Family Medicine

## 2024-07-06 ENCOUNTER — Ambulatory Visit

## 2024-07-06 VITALS — BP 133/72 | HR 92 | Temp 98.0°F | Ht 67.0 in | Wt 219.0 lb

## 2024-07-06 DIAGNOSIS — F419 Anxiety disorder, unspecified: Secondary | ICD-10-CM | POA: Insufficient documentation

## 2024-07-06 DIAGNOSIS — F331 Major depressive disorder, recurrent, moderate: Secondary | ICD-10-CM | POA: Insufficient documentation

## 2024-07-06 DIAGNOSIS — Z635 Disruption of family by separation and divorce: Secondary | ICD-10-CM

## 2024-07-06 MED ORDER — ESCITALOPRAM OXALATE 10 MG PO TABS: 10.0000 mg | ORAL_TABLET | Freq: Every day | ORAL | 1 refills | Status: AC

## 2024-07-06 NOTE — Patient Instructions (Signed)
Taking the medicine as directed and not missing any doses is one of the best things you can do to treat your depression.  Here are some things to keep in mind:  Side effects (stomach upset, some increased anxiety) may happen before you notice a benefit.  These side effects typically go away over time. Changes to your dose of medicine or a change in medication all together is sometimes necessary Most people need to be on medication at least 12 months Many people will notice an improvement within two weeks but the full effect of the medication can take up to 4-6 weeks Stopping the medication when you start feeling better often results in a return of symptoms Never discontinue your medication without contacting a health care professional first.  Some medications require gradual discontinuation/ taper and can make you sick if you stop them abruptly.  If your symptoms worsen or you have thoughts of suicide/homicide, PLEASE SEEK IMMEDIATE MEDICAL ATTENTION.  You may always call:  National Suicide Hotline: 7542058722 [place]: [phone] [facility] in [place]: 2548116572   These are available 24 hours a day, 7 days a week.

## 2024-07-06 NOTE — Progress Notes (Signed)
 "  Subjective: RR:izemzddpnw PCP: Zollie Lowers, MD YEP:Gjdnw Ricky Day is a 38 y.o. male presenting to clinic today for:  Patient reports that he has been having some increasing anxiety and depressive symptoms over the last 3 to 4 months.  He admits to lability in his mood, depressed mood, increased irritability and poor sleep.  On average he sleeps 4 to 5 hours per night.  He does not report any specific triggers in last 3 to 4 months but notes that he separated from his spouse, of his own volition, 8 months ago.  He shares custody with her other 3 children.  He reports financial stability and has adequate access to housing and food.  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  He has a good boss that has been very understanding.  He notes recently had a panic attack where his chest was supertight and he felt extremely drained and fatigued afterwards.  He is seen a counselor for the last couple of months and is seeing them about every week but has not noticed a huge improvement in symptomology.  His counselor is at [employer].  He reports having seen somebody several years ago for depressive symptoms and was prescribed Wellbutrin and Atarax but never started the medication because he was advised that Atarax would cause excessive sleepiness and he did not want to have impairment on his job, which he had newly started 3 years ago.  He reports no history of depression or anxiety diagnosis prior to 3 years ago but may have had some level of this when he was younger it was just never diagnosed.  He denies any personal history of substance use disorders including alcohol.  Family history significant for major depressive disorder in both his mother and sister, who are treated with SSRIs.  His father is deceased so he is not sure if there was a mental health history there.   ROS: Per HPI  Allergies[1] Past Medical History:  Diagnosis Date   Depression    Current  Medications[2] Social History   Socioeconomic History   Marital status: Married    Spouse name: Augustin   Number of children: 2   Years of education: Not on file   Highest education level: Not on file  Occupational History   Not on file  Tobacco Use   Smoking status: Never   Smokeless tobacco: Never  Vaping Use   Vaping status: Never Used  Substance and Sexual Activity   Alcohol use: Yes    Alcohol/week: 1.0 standard drink of alcohol    Types: 1 Shots of liquor per week    Comment: occasional   Drug use: Never   Sexual activity: Yes    Birth control/protection: None  Other Topics Concern   Not on file  Social History Narrative   Not on file   Social Drivers of Health   Tobacco Use: Low Risk (10/22/2022)   Patient History    Smoking Tobacco Use: Never    Smokeless Tobacco Use: Never    Passive Exposure: Not on file  Financial Resource Strain: Not on file  Food Insecurity: Not on file  Transportation Needs: Not on file  Physical Activity: Not on file  Stress: Not on file  Social Connections: Not on file  Intimate Partner Violence: Not on file  Depression (PHQ2-9): High Risk (05/02/2022)   Depression (PHQ2-9)    PHQ-2 Score: 20  Alcohol Screen: Not on file  Housing:  Not on file  Utilities: Not on file  Health Literacy: Low Risk (01/18/2022)   Received from [facility]    How often do you need to have someone help you when you read instructions, pamphlets, or other written material from your doctor or pharmacy?: Never   Family History  Problem Relation Age of Onset   Seizures Mother    Diabetes Maternal Grandmother     Objective: Office vital signs reviewed. BP 133/72   Pulse 92   Temp 98 F (36.7 C)   Ht 5' 7 (1.702 m)   Wt 219 lb (99.3 kg)   SpO2 94%   BMI 34.30 kg/m   Physical Examination:  General: Awake, alert, well-nourished, nontoxic-appearing male HEENT: Sclera white.  No thyromegaly, thyroid nodules or masses. Cardio:  regular rate and rhythm, S1S2 heard, no murmurs appreciated Pulm: clear to auscultation bilaterally, no wheezes, rhonchi or rales; normal work of breathing on room air Psych: Somewhat tearful.  Pleasant, interactive.  Does not appear to be responding to internal stimuli     07/06/2024   11:08 AM 05/02/2022   11:47 AM 05/02/2022   10:55 AM  Depression screen PHQ 2/9  Decreased Interest 1 2 0  Down, Depressed, Hopeless 3 3 0  PHQ - 2 Score 4 5 0  Altered sleeping 3 3   Tired, decreased energy 0 3   Change in appetite 2 3   Feeling bad or failure about yourself  3 0   Trouble concentrating 2 2   Moving slowly or fidgety/restless 0 2   Suicidal thoughts 1 2   PHQ-9 Score 15 20    Difficult doing work/chores Somewhat difficult Very difficult      Data saved with a previous flowsheet row definition      07/06/2024   11:09 AM 05/02/2022   11:47 AM  GAD 7 : Generalized Anxiety Score  Nervous, Anxious, on Edge 3 3  Control/stop worrying 3 3  Worry too much - different things 3 3  Trouble relaxing 3 2  Restless 3 2  Easily annoyed or irritable 3 3  Afraid - awful might happen 3 3  Total GAD 7 Score 21 19  Anxiety Difficulty Very difficult Very difficult    Assessment/ Plan: 38 y.o. male   Moderate episode of recurrent major depressive disorder (HCC) - Plan: escitalopram  (LEXAPRO ) 10 MG tablet, CMP14+EGFR, TSH + free T4, VITAMIN D  25 Hydroxy (Vit-D Deficiency, Fractures)  Anxiety disorder with panic attacks - Plan: escitalopram  (LEXAPRO ) 10 MG tablet, CMP14+EGFR, TSH + free T4, VITAMIN D  25 Hydroxy (Vit-D Deficiency, Fractures)  Separated from spouse   Both anxiety depression are not well-controlled currently.  I did a mood questionnaire which was also positive so there is a potential for an underlying mood disorder.  Though no family history of bipolar disorder or other mood disorders were identified on today's history.  Will trial him on Lexapro  10 mg daily.  I encouraged him  to reach out to me should he have any unwanted side effects or other concerns prior to our next visit which has been scheduled for 6 weeks out.  He will continue counseling services as directed.  May consider adding something like Seroquel at bedtime if needed for ongoing mood stability and sleep assistance, though swing shift does play a part in ability to dose that regularly.  Will collect labs to ensure no metabolic disturbance contributing to symptoms though physical exam was fairly unremarkable.  Total time spent with patient 29 minutes.  Greater than 50% of encounter spent in coordination of care/counseling.    Norene CHRISTELLA Fielding, DO [facility] [phone]     [1] No Known Allergies [2]  Current Outpatient Medications:    buPROPion (WELLBUTRIN XL) 150 MG 24 hr tablet, Take 150 mg by mouth daily., Disp: , Rfl:    fluticasone  (FLONASE ) 50 MCG/ACT nasal spray, Place 1 spray into both nostrils 2 (two) times daily as needed for allergies or rhinitis., Disp: 16 g, Rfl: 6   hydrOXYzine (ATARAX) 25 MG tablet, Take 25 mg by mouth daily as needed., Disp: , Rfl:   "

## 2024-07-07 ENCOUNTER — Ambulatory Visit: Payer: Self-pay | Admitting: Family Medicine

## 2024-07-07 DIAGNOSIS — E559 Vitamin D deficiency, unspecified: Secondary | ICD-10-CM

## 2024-07-07 LAB — CMP14+EGFR
ALT: 28 IU/L (ref 0–44)
AST: 29 IU/L (ref 0–40)
Albumin: 4.5 g/dL (ref 4.1–5.1)
Alkaline Phosphatase: 85 IU/L (ref 47–123)
BUN/Creatinine Ratio: 14 (ref 9–20)
BUN: 15 mg/dL (ref 6–20)
Bilirubin Total: 1 mg/dL (ref 0.0–1.2)
CO2: 23 mmol/L (ref 20–29)
Calcium: 9.7 mg/dL (ref 8.7–10.2)
Chloride: 103 mmol/L (ref 96–106)
Creatinine, Ser: 1.1 mg/dL (ref 0.76–1.27)
Globulin, Total: 2.1 g/dL (ref 1.5–4.5)
Glucose: 101 mg/dL — ABNORMAL HIGH (ref 70–99)
Potassium: 4.2 mmol/L (ref 3.5–5.2)
Sodium: 138 mmol/L (ref 134–144)
Total Protein: 6.6 g/dL (ref 6.0–8.5)
eGFR: 89 mL/min/1.73

## 2024-07-07 LAB — TSH+FREE T4
Free T4: 1.27 ng/dL (ref 0.82–1.77)
TSH: 1.66 u[IU]/mL (ref 0.450–4.500)

## 2024-07-07 LAB — VITAMIN D 25 HYDROXY (VIT D DEFICIENCY, FRACTURES): Vit D, 25-Hydroxy: 12 ng/mL — ABNORMAL LOW (ref 30.0–100.0)

## 2024-07-07 MED ORDER — VITAMIN D (ERGOCALCIFEROL) 1.25 MG (50000 UNIT) PO CAPS: 50000.0000 [IU] | ORAL_CAPSULE | ORAL | 0 refills | Status: AC

## 2024-08-04 ENCOUNTER — Ambulatory Visit (INDEPENDENT_AMBULATORY_CARE_PROVIDER_SITE_OTHER): Admitting: Nurse Practitioner

## 2024-08-04 ENCOUNTER — Encounter: Payer: Self-pay | Admitting: Nurse Practitioner

## 2024-08-04 VITALS — BP 132/82 | HR 87 | Temp 99.0°F | Ht 67.0 in | Wt 233.0 lb

## 2024-08-04 DIAGNOSIS — H1032 Unspecified acute conjunctivitis, left eye: Secondary | ICD-10-CM

## 2024-08-04 DIAGNOSIS — H66003 Acute suppurative otitis media without spontaneous rupture of ear drum, bilateral: Secondary | ICD-10-CM | POA: Diagnosis not present

## 2024-08-04 DIAGNOSIS — R0981 Nasal congestion: Secondary | ICD-10-CM | POA: Insufficient documentation

## 2024-08-04 MED ORDER — AZELASTINE HCL 0.1 % NA SOLN: 1.0000 | Freq: Two times a day (BID) | NASAL | 5 refills | Status: AC

## 2024-08-04 MED ORDER — POLYMYXIN B-TRIMETHOPRIM 10000-0.1 UNIT/ML-% OP SOLN: 1.0000 [drp] | Freq: Four times a day (QID) | OPHTHALMIC | 0 refills | Status: AC

## 2024-08-04 MED ORDER — AMOXICILLIN 875 MG PO TABS: 875.0000 mg | ORAL_TABLET | Freq: Two times a day (BID) | ORAL | 0 refills | Status: AC

## 2024-08-04 NOTE — Progress Notes (Signed)
 "    Subjective:  Patient ID: Ricky Day, male    DOB: 1986-05-26, 39 y.o.   MRN: [id number]  Patient Care Team: Zollie Lowers, MD as PCP - General (Family Medicine)   Chief Complaint:  flu/cold like symptoms (X 4 days)   HPI: Ricky Day is a 39 y.o. male presenting on 08/04/2024 for flu/cold like symptoms (X 4 days)   Discussed the use of AI scribe software for clinical note transcription with the patient, who gave verbal consent to proceed.  History of Present Illness Ricky Day is a 39 year old male who presents with upper respiratory symptoms and left eye discomfort.  He has been experiencing upper respiratory symptoms for the past three to four days, including nasal congestion, rhinorrhea, headache, fatigue, and a mild cough. The nasal discharge is clear. No fever is reported, although he feels hot due to the heating in his environment. He has been using Mucinex D, which provides temporary relief for about thirty minutes before symptoms return. He has not conducted any home tests for COVID-19 or flu.  He reports soreness in his left eye, which is more crusty than usual upon waking. This discomfort has been persistent alongside his respiratory symptoms.  He experiences mild muscle pain and body aches and reports fatigue, noting that he has not slept much over the past two to three days. Despite these symptoms, he continues to work, although he feels tired and notes a decreased ability to run far.      Relevant past medical, surgical, family, and social history reviewed and updated as indicated.  Allergies and medications reviewed and updated. Data reviewed: Chart in Epic.   Past Medical History:  Diagnosis Date   Depression     Past Surgical History:  Procedure Laterality Date   ANTERIOR CRUCIATE LIGAMENT REPAIR Right    INGUINAL HERNIA REPAIR Left     Social History   Socioeconomic History   Marital status: Married    Spouse name: Augustin   Number of children:  2   Years of education: Not on file   Highest education level: Not on file  Occupational History   Not on file  Tobacco Use   Smoking status: Never   Smokeless tobacco: Never  Vaping Use   Vaping status: Never Used  Substance and Sexual Activity   Alcohol use: Yes    Alcohol/week: 1.0 standard drink of alcohol    Types: 1 Shots of liquor per week    Comment: occasional   Drug use: Never   Sexual activity: Yes    Birth control/protection: None  Other Topics Concern   Not on file  Social History Narrative   Not on file   Social Drivers of Health   Tobacco Use: Low Risk (08/04/2024)   Patient History    Smoking Tobacco Use: Never    Smokeless Tobacco Use: Never    Passive Exposure: Not on file  Financial Resource Strain: Not on file  Food Insecurity: Not on file  Transportation Needs: Not on file  Physical Activity: Not on file  Stress: Not on file  Social Connections: Not on file  Intimate Partner Violence: Not on file  Depression (EYV7-0): Low Risk (08/04/2024)   Depression (PHQ2-9)    PHQ-2 Score: 4  Recent Concern: Depression (PHQ2-9) - High Risk (07/06/2024)   Depression (PHQ2-9)    PHQ-2 Score: 15  Alcohol Screen: Not on file  Housing: Not on file  Utilities: Not on file  Health  Literacy: Low Risk (01/18/2022)   Received from [facility]    How often do you need to have someone help you when you read instructions, pamphlets, or other written material from your doctor or pharmacy?: Never    Outpatient Encounter Medications as of 08/04/2024  Medication Sig   amoxicillin  (AMOXIL ) 875 MG tablet Take 1 tablet (875 mg total) by mouth 2 (two) times daily for 10 days.   azelastine  (ASTELIN ) 0.1 % nasal spray Place 1 spray into both nostrils 2 (two) times daily. Use in each nostril as directed   trimethoprim -polymyxin b  (POLYTRIM ) ophthalmic solution Place 1 drop into the left eye every 6 (six) hours.   buPROPion (WELLBUTRIN XL) 150 MG 24 hr tablet  Take 150 mg by mouth daily. (Patient not taking: Reported on 07/06/2024)   escitalopram  (LEXAPRO ) 10 MG tablet Take 1 tablet (10 mg total) by mouth daily.   fluticasone  (FLONASE ) 50 MCG/ACT nasal spray Place 1 spray into both nostrils 2 (two) times daily as needed for allergies or rhinitis.   hydrOXYzine (ATARAX) 25 MG tablet Take 25 mg by mouth daily as needed. (Patient not taking: Reported on 07/06/2024)   Vitamin D , Ergocalciferol , (DRISDOL ) 1.25 MG (50000 UNIT) CAPS capsule Take 1 capsule (50,000 Units total) by mouth every 7 (seven) days. X12 weeks. Then switch to Vit D 400IU OTC daily.   No facility-administered encounter medications on file as of 08/04/2024.    Allergies[1]  Pertinent ROS per HPI, otherwise unremarkable      Objective:  BP 132/82   Pulse 87   Temp 99 F (37.2 C) (Oral)   Ht 5' 7 (1.702 m)   Wt 233 lb (105.7 kg)   SpO2 95%   BMI 36.49 kg/m    Wt Readings from Last 3 Encounters:  08/04/24 233 lb (105.7 kg)  07/06/24 219 lb (99.3 kg)  05/02/22 226 lb 12.8 oz (102.9 kg)    Physical Exam Vitals and nursing note reviewed.  Constitutional:      General: He is not in acute distress.    Appearance: Normal appearance.  HENT:     Head: Normocephalic and atraumatic.     Right Ear: Tympanic membrane is erythematous and bulging.     Left Ear: Tympanic membrane is erythematous and bulging.     Nose: Congestion and rhinorrhea present.     Right Turbinates: Swollen.     Left Turbinates: Swollen.     Right Sinus: Frontal sinus tenderness present.     Left Sinus: Frontal sinus tenderness present.     Mouth/Throat:     Mouth: Mucous membranes are moist.     Pharynx: Postnasal drip present.  Eyes:     General: Lids are normal.        Left eye: Discharge present.    Extraocular Movements:     Left eye: Normal extraocular motion and no nystagmus.     Conjunctiva/sclera:     Left eye: Exudate present.     Pupils: Pupils are equal, round, and reactive to light.   Cardiovascular:     Heart sounds: Normal heart sounds.  Pulmonary:     Effort: Pulmonary effort is normal.     Breath sounds: Normal breath sounds.  Musculoskeletal:        General: Normal range of motion.     Right lower leg: No edema.     Left lower leg: No edema.  Skin:    General: Skin is warm and dry.  Findings: No rash.  Neurological:     Mental Status: He is alert and oriented to person, place, and time.  Psychiatric:        Mood and Affect: Mood normal.        Behavior: Behavior normal.        Thought Content: Thought content normal.        Judgment: Judgment normal.    Physical Exam HEENT: Sinus pressure present     Results for orders placed or performed in visit on 07/06/24  CMP14+EGFR   Collection Time: 07/06/24 11:42 AM  Result Value Ref Range   Glucose 101 (H) 70 - 99 mg/dL   BUN 15 6 - 20 mg/dL   Creatinine, Ser 8.89 0.76 - 1.27 mg/dL   eGFR 89 >40 fO/fpw/8.26   BUN/Creatinine Ratio 14 9 - 20   Sodium 138 134 - 144 mmol/L   Potassium 4.2 3.5 - 5.2 mmol/L   Chloride 103 96 - 106 mmol/L   CO2 23 20 - 29 mmol/L   Calcium 9.7 8.7 - 10.2 mg/dL   Total Protein 6.6 6.0 - 8.5 g/dL   Albumin 4.5 4.1 - 5.1 g/dL   Globulin, Total 2.1 1.5 - 4.5 g/dL   Bilirubin Total 1.0 0.0 - 1.2 mg/dL   Alkaline Phosphatase 85 47 - 123 IU/L   AST 29 0 - 40 IU/L   ALT 28 0 - 44 IU/L  TSH + free T4   Collection Time: 07/06/24 11:42 AM  Result Value Ref Range   TSH 1.660 0.450 - 4.500 uIU/mL   Free T4 1.27 0.82 - 1.77 ng/dL  VITAMIN D  25 Hydroxy (Vit-D Deficiency, Fractures)   Collection Time: 07/06/24 11:42 AM  Result Value Ref Range   Vit D, 25-Hydroxy 12.0 (L) 30.0 - 100.0 ng/mL       Pertinent labs & imaging results that were available during my care of the patient were reviewed by me and considered in my medical decision making.  Assessment & Plan:  Ricky Day was seen today for flu/cold like symptoms.  Diagnoses and all orders for this visit:  Non-recurrent  acute suppurative otitis media of both ears without spontaneous rupture of tympanic membranes -     amoxicillin  (AMOXIL ) 875 MG tablet; Take 1 tablet (875 mg total) by mouth 2 (two) times daily for 10 days.  Nasal congestion -     azelastine  (ASTELIN ) 0.1 % nasal spray; Place 1 spray into both nostrils 2 (two) times daily. Use in each nostril as directed  Acute bacterial conjunctivitis of left eye -     trimethoprim -polymyxin b  (POLYTRIM ) ophthalmic solution; Place 1 drop into the left eye every 6 (six) hours.     Assessment and Plan Ricky Day is a 39 year old male seen today for otitis media, no acute distress Assessment & Plan Acute suppurative otitis media, bilateral Bilateral ear infection with mild pain and low pressure. - Prescribed amoxicillin  875 mg twice a day for 10 days with food.  Acute bacterial conjunctivitis, left eye Left eye conjunctivitis with soreness and crusting, improving. - Prescribed polymyxin B  ophthalmic ointment every four hours in the left eye. - Advised warm compresses for the left eye.  Acute upper respiratory infection Symptoms include nasal congestion, rhinorrhea, headache, fatigue, and mild cough. No fever or purulent nasal discharge. - Prescribed Astelin  nasal spray for nasal congestion. - Advised hydration and rest.      Continue all other maintenance medications.  Follow up plan: No follow-ups on file.   Continue  healthy lifestyle choices, including diet (rich in fruits, vegetables, and lean proteins, and low in salt and simple carbohydrates) and exercise (at least 30 minutes of moderate physical activity daily).  Educational handout given for   Clinical References  Otitis Media, Adult  Otitis media is a condition in which the middle ear is red and swollen (inflamed) and full of fluid. The middle ear is the part of the ear that contains bones for hearing as well as air that helps send sounds to the brain. The condition usually goes away on  its own. What are the causes? This condition is caused by a blockage in the eustachian tube. This tube connects the middle ear to the back of the nose. It normally allows air into the middle ear. The blockage is caused by fluid or swelling. Problems that can cause blockage include: A cold or infection that affects the nose, mouth, or throat. Allergies. An irritant, such as tobacco smoke. Adenoids that have become large. The adenoids are soft tissue located in the back of the throat, behind the nose and the roof of the mouth. Growth or swelling in the upper part of the throat, just behind the nose (nasopharynx). Damage to the ear caused by a change in pressure. This is called barotrauma. What increases the risk? You are more likely to develop this condition if you: Smoke or are exposed to tobacco smoke. Have an opening in the roof of your mouth (cleft palate). Have acid reflux. Have problems in your body's defense system (immune system). What are the signs or symptoms? Symptoms of this condition include: Ear pain. Fever. Problems with hearing. Being tired. Fluid leaking from the ear. Ringing in the ear. How is this treated? This condition can go away on its own within 3-5 days. But if the condition is caused by germs (bacteria) and does not go away on its own, or if it keeps coming back, your doctor may: Give you antibiotic medicines. Give you medicines for pain. Follow these instructions at home: Take over-the-counter and prescription medicines only as told by your doctor. If you were prescribed an antibiotic medicine, take it as told by your doctor. Do not stop taking it even if you start to feel better. Keep all follow-up visits. Contact a doctor if: You have bleeding from your nose. There is a lump on your neck. You are not feeling better in 5 days. You feel worse instead of better. Get help right away if: You have pain that is not helped with medicine. You have swelling,  redness, or pain around your ear. You get a stiff neck. You cannot move part of your face (paralysis). You notice that the bone behind your ear hurts when you touch it. You get a very bad headache. Summary Otitis media means that the middle ear is red, swollen, and full of fluid. This condition usually goes away on its own. If the problem does not go away, treatment may be needed. You may be given medicines to treat the infection or to treat your pain. If you were prescribed an antibiotic medicine, take it as told by your doctor. Do not stop taking it even if you start to feel better. Keep all follow-up visits. This information is not intended to replace advice given to you by your health care provider. Make sure you discuss any questions you have with your health care provider. Document Revised: 10/10/2020 Document Reviewed: 10/10/2020 Elsevier Patient Education  2024 [employer] Bacterial Conjunctivitis, Adult Bacterial conjunctivitis is  an infection of your conjunctiva. This is the clear membrane that covers the white part of your eye and the inner part of your eyelid. This infection can make your eye: Red or pink. Itchy or irritated. This condition spreads easily from person to person (is contagious) and from one eye to the other eye. What are the causes? This condition is caused by germs (bacteria). You may get the infection if you come into close contact with: A person who has the infection. Items that have germs on them (are contaminated), such as face towels, contact lens solution, or eye makeup. What increases the risk? You are more likely to get this condition if: You have contact with people who have the infection. You wear contact lenses. You have a sinus infection. You have had a recent eye injury or surgery. You have a weak body defense system (immune system). You have dry eyes. What are the signs or symptoms?  Thick, yellowish discharge from the eye. Tearing or  watery eyes. Itchy eyes. Burning feeling in your eyes. Eye redness. Swollen eyelids. Blurred vision. How is this treated?  Antibiotic eye drops or ointment. Antibiotic medicine taken by mouth. This is used for infections that do not get better with drops or ointment or that last more than 10 days. Cool, wet cloths placed on the eyes. Artificial tears used 2-6 times a day. Follow these instructions at home: Medicines Take or apply your antibiotic medicine as told by your doctor. Do not stop using it even if you start to feel better. Take or apply over-the-counter and prescription medicines only as told by your doctor. Do not touch your eyelid with the eye-drop bottle or the ointment tube. Managing discomfort Wipe any fluid from your eye with a warm, wet washcloth or a cotton ball. Place a clean, cool, wet cloth on your eye. Do this for 10-20 minutes, 3-4 times a day. General instructions Do not wear contacts until the infection is gone. Wear glasses until your doctor says it is okay to wear contacts again. Do not wear eye makeup until the infection is gone. Throw away old eye makeup. Change or wash your pillowcase every day. Do not share towels or washcloths. Wash your hands often with soap and water for at least 20 seconds and especially before touching your face or eyes. Use paper towels to dry your hands. Do not touch or rub your eyes. Do not drive or use heavy machinery if your vision is blurred. Contact a doctor if: You have a fever. You do not get better after 10 days. Get help right away if: You have a fever and your symptoms get worse all of a sudden. You have very bad pain when you move your eye. Your face: Hurts. Is red. Is swollen. You have sudden loss of vision. Summary Bacterial conjunctivitis is an infection of your conjunctiva. This infection spreads easily from person to person. Wash your hands often with soap and water for at least 20 seconds and especially  before touching your face or eyes. Use paper towels to dry your hands. Take or apply your antibiotic medicine as told by your doctor. Contact a doctor if you have a fever or you do not get better after 10 days. This information is not intended to replace advice given to you by your health care provider. Make sure you discuss any questions you have with your health care provider. Document Revised: 10/12/2020 Document Reviewed: 10/12/2020 Elsevier Patient Education  2024 [employer].  Sinus Infection, Adult A sinus infection is soreness and swelling (inflammation) of your sinuses. Sinuses are hollow spaces in the bones around your face. They are located: Around your eyes. In the middle of your forehead. Behind your nose. In your cheekbones. Your sinuses and nasal passages are lined with a fluid called mucus. Mucus drains out of your sinuses. Swelling can trap mucus in your sinuses. This lets germs (bacteria, virus, or fungus) grow, which leads to infection. Most of the time, this condition is caused by a virus. What are the causes? Allergies. Asthma. Germs. Things that block your nose or sinuses. Growths in the nose (nasal polyps). Chemicals or irritants in the air. A fungus. This is rare. What increases the risk? Having a weak body defense system (immune system). Doing a lot of swimming or diving. Using nasal sprays too much. Smoking. What are the signs or symptoms? The main symptoms of this condition are pain and a feeling of pressure around the sinuses. Other symptoms include: Stuffy nose (congestion). This may make it hard to breathe through your nose. Runny nose (drainage). Soreness, swelling, and warmth in the sinuses. A cough that may get worse at night. Being unable to smell and taste. Mucus that collects in the throat or the back of the nose (postnasal drip). This may cause a sore throat or bad breath. Being very tired (fatigued). A fever. How is this diagnosed? Your  symptoms. Your medical history. A physical exam. Tests to find out if your condition is short-term (acute) or long-term (chronic). Your doctor may: Check your nose for growths (polyps). Check your sinuses using a tool that has a light on one end (endoscope). Check for allergies or germs. Do imaging tests, such as an MRI or CT scan. How is this treated? Treatment for this condition depends on the cause and whether it is short-term or long-term. If caused by a virus, your symptoms should go away on their own within 10 days. You may be given medicines to relieve symptoms. They include: Medicines that shrink swollen tissue in the nose. A spray that treats swelling of the nostrils. Rinses that help get rid of thick mucus in your nose (nasal saline washes). Medicines that treat allergies (antihistamines). Over-the-counter pain relievers. If caused by bacteria, your doctor may wait to see if you will get better without treatment. You may be given antibiotic medicine if you have: A very bad infection. A weak body defense system. If caused by growths in the nose, surgery may be needed. Follow these instructions at home: Medicines Take, use, or apply over-the-counter and prescription medicines only as told by your doctor. These may include nasal sprays. If you were prescribed an antibiotic medicine, take it as told by your doctor. Do not stop taking it even if you start to feel better. Hydrate and humidify  Drink enough water to keep your pee (urine) pale yellow. Use a cool mist humidifier to keep the humidity level in your home above 50%. Breathe in steam for 10-15 minutes, 3-4 times a day, or as told by your doctor. You can do this in the bathroom while a hot shower is running. Try not to spend time in cool or dry air. Rest Rest as much as you can. Sleep with your head raised (elevated). Make sure you get enough sleep each night. General instructions  Put a warm, moist washcloth on your  face 3-4 times a day, or as often as told by your doctor. Use nasal saline washes as often  as told by your doctor. Wash your hands often with soap and water. If you cannot use soap and water, use hand sanitizer. Do not smoke. Avoid being around people who are smoking (secondhand smoke). Keep all follow-up visits. Contact a doctor if: You have a fever. Your symptoms get worse. Your symptoms do not get better within 10 days. Get help right away if: You have a very bad headache. You cannot stop vomiting. You have very bad pain or swelling around your face or eyes. You have trouble seeing. You feel confused. Your neck is stiff. You have trouble breathing. These symptoms may be an emergency. Get help right away. Call 911. Do not wait to see if the symptoms will go away. Do not drive yourself to the hospital. Summary A sinus infection is swelling of your sinuses. Sinuses are hollow spaces in the bones around your face. This condition is caused by tissues in your nose that become inflamed or swollen. This traps germs. These can lead to infection. If you were prescribed an antibiotic medicine, take it as told by your doctor. Do not stop taking it even if you start to feel better. Keep all follow-up visits. This information is not intended to replace advice given to you by your health care provider. Make sure you discuss any questions you have with your health care provider. Document Revised: 06/06/2021 Document Reviewed: 06/06/2021 Elsevier Patient Education  2024 Elsevier Inc.    The above assessment and management plan was discussed with the patient. The patient verbalized understanding of and has agreed to the management plan. Patient is aware to call the clinic if they develop any new symptoms or if symptoms persist or worsen. Patient is aware when to return to the clinic for a follow-up visit. Patient educated on when it is appropriate to go to the emergency department.   [address]  Louis Thompson, DNP [facility] [address] [place], [place] [address] [phone]       [1] No Known Allergies  "

## 2024-08-18 ENCOUNTER — Ambulatory Visit: Admitting: Family Medicine

## 2024-08-18 NOTE — Progress Notes (Unsigned)
 "  Subjective: CC:*** PCP: Ricky Lowers, MD Ricky Day is a 39 y.o. male presenting to clinic today for:  ***   ROS: Per HPI  Allergies[1] Past Medical History:  Diagnosis Date   Depression    Current Medications[2] Social History   Socioeconomic History   Marital status: Married    Spouse name: Ricky Day   Number of children: 2   Years of education: Not on file   Highest education level: Not on file  Occupational History   Not on file  Tobacco Use   Smoking status: Never   Smokeless tobacco: Never  Vaping Use   Vaping status: Never Used  Substance and Sexual Activity   Alcohol use: Yes    Alcohol/week: 1.0 standard drink of alcohol    Types: 1 Shots of liquor per week    Comment: occasional   Drug use: Never   Sexual activity: Yes    Birth control/protection: None  Other Topics Concern   Not on file  Social History Narrative   Not on file   Social Drivers of Health   Tobacco Use: Low Risk (08/04/2024)   Patient History    Smoking Tobacco Use: Never    Smokeless Tobacco Use: Never    Passive Exposure: Not on file  Financial Resource Strain: Not on file  Food Insecurity: Not on file  Transportation Needs: Not on file  Physical Activity: Not on file  Stress: Not on file  Social Connections: Not on file  Intimate Partner Violence: Not on file  Depression (PHQ2-9): Low Risk (08/04/2024)   Depression (PHQ2-9)    PHQ-2 Score: 4  Recent Concern: Depression (PHQ2-9) - High Risk (07/06/2024)   Depression (PHQ2-9)    PHQ-2 Score: 15  Alcohol Screen: Not on file  Housing: Not on file  Utilities: Not on file  Health Literacy: Low Risk (01/18/2022)   Received from [facility] Literacy    How often do you need to have someone help you when you read instructions, pamphlets, or other written material from your doctor or pharmacy?: Never   Family History  Problem Relation Age of Onset   Seizures Mother    Depression Mother    Depression Sister     Diabetes Maternal Grandmother     Objective: Office vital signs reviewed. There were no vitals taken for this visit.  Physical Examination:  General: Awake, alert, *** nourished, No acute distress HEENT: Normal    Neck: No masses palpated. No lymphadenopathy    Ears: Tympanic membranes intact, normal light reflex, no erythema, no bulging    Eyes: PERRLA, extraocular membranes intact, sclera ***    Nose: nasal turbinates moist, *** nasal discharge    Throat: moist mucus membranes, no erythema, *** tonsillar exudate.  Airway is patent Cardio: regular rate and rhythm, S1S2 heard, no murmurs appreciated Pulm: clear to auscultation bilaterally, no wheezes, rhonchi or rales; normal work of breathing on room air GI: soft, non-tender, non-distended, bowel sounds present x4, no hepatomegaly, no splenomegaly, no masses GU: external vaginal tissue ***, cervix ***, *** punctate lesions on cervix appreciated, *** discharge from cervical os, *** bleeding, *** cervical motion tenderness, *** abdominal/ adnexal masses Extremities: warm, well perfused, No edema, cyanosis or clubbing; +*** pulses bilaterally MSK: *** gait and *** station Skin: dry; intact; no rashes or lesions Neuro: *** Strength and light touch sensation grossly intact, *** DTRs ***/4  Assessment/ Plan: 39 y.o. male   Anxiety disorder with panic attacks  Moderate episode of recurrent major depressive disorder (HCC)   ***   Ricky Day Ricky Fielding, DO [facility] [phone]     [1] No Known Allergies [2]  Current Outpatient Medications:    azelastine  (ASTELIN ) 0.1 % nasal spray, Place 1 spray into both nostrils 2 (two) times daily. Use in each nostril as directed, Disp: 30 mL, Rfl: 5   buPROPion (WELLBUTRIN XL) 150 MG 24 hr tablet, Take 150 mg by mouth daily. (Patient not taking: Reported on 07/06/2024), Disp: , Rfl:    escitalopram  (LEXAPRO ) 10 MG tablet, Take 1 tablet (10 mg total) by mouth  daily., Disp: 30 tablet, Rfl: 1   fluticasone  (FLONASE ) 50 MCG/ACT nasal spray, Place 1 spray into both nostrils 2 (two) times daily as needed for allergies or rhinitis., Disp: 16 g, Rfl: 6   hydrOXYzine (ATARAX) 25 MG tablet, Take 25 mg by mouth daily as needed. (Patient not taking: Reported on 07/06/2024), Disp: , Rfl:    trimethoprim -polymyxin b  (POLYTRIM ) ophthalmic solution, Place 1 drop into the left eye every 6 (six) hours., Disp: 10 mL, Rfl: 0   Vitamin D , Ergocalciferol , (DRISDOL ) 1.25 MG (50000 UNIT) CAPS capsule, Take 1 capsule (50,000 Units total) by mouth every 7 (seven) days. X12 weeks. Then switch to Vit D 400IU OTC daily., Disp: 12 capsule, Rfl: 0  "
